# Patient Record
Sex: Female | Born: 1985
Health system: Southern US, Community
[De-identification: ages and names within clinical notes are randomized; demographics above are authoritative.]

## PROBLEM LIST (undated history)

## (undated) DIAGNOSIS — I10 Essential (primary) hypertension: Secondary | ICD-10-CM

---

## 2015-04-25 ENCOUNTER — Emergency Department (HOSPITAL_BASED_OUTPATIENT_CLINIC_OR_DEPARTMENT_OTHER)
Admission: EM | Admit: 2015-04-25 | Discharge: 2015-04-25 | Disposition: A | Discharge status: Home / Self Care | Payer: No Typology Code available for payment source | Attending: Emergency Medicine | Admitting: Emergency Medicine

## 2015-04-25 ENCOUNTER — Encounter (HOSPITAL_BASED_OUTPATIENT_CLINIC_OR_DEPARTMENT_OTHER): Payer: Self-pay | Admitting: *Deleted

## 2015-04-25 DIAGNOSIS — J029 Acute pharyngitis, unspecified: Secondary | ICD-10-CM | POA: Diagnosis present

## 2015-04-25 DIAGNOSIS — J020 Streptococcal pharyngitis: Secondary | ICD-10-CM | POA: Insufficient documentation

## 2015-04-25 LAB — RAPID STREP SCREEN (MED CTR MEBANE ONLY): Streptococcus, Group A Screen (Direct): POSITIVE — AB

## 2015-04-25 MED ORDER — SODIUM CHLORIDE 0.9 % IV BOLUS (SEPSIS)
1000.0000 mL | Freq: Once | INTRAVENOUS | Status: AC
  Administered 2015-04-25: 1000 mL via INTRAVENOUS

## 2015-04-25 MED ORDER — ACETAMINOPHEN-CODEINE 120-12 MG/5ML PO SOLN: 10.0000 mL | ORAL | Status: DC | PRN

## 2015-04-25 MED ORDER — IBUPROFEN 800 MG PO TABS
800.0000 mg | ORAL_TABLET | Freq: Once | ORAL | Status: AC
  Administered 2015-04-25: 800 mg via ORAL
  Filled 2015-04-25: qty 1

## 2015-04-25 MED ORDER — ACETAMINOPHEN 325 MG PO TABS
650.0000 mg | ORAL_TABLET | Freq: Once | ORAL | Status: AC
  Administered 2015-04-25: 650 mg via ORAL
  Filled 2015-04-25: qty 2

## 2015-04-25 MED ORDER — PENICILLIN G BENZATHINE 1200000 UNIT/2ML IM SUSP
1.2000 10*6.[IU] | Freq: Once | INTRAMUSCULAR | Status: AC
  Administered 2015-04-25: 1.2 10*6.[IU] via INTRAMUSCULAR
  Filled 2015-04-25: qty 2

## 2015-04-25 NOTE — ED Notes (Signed)
Pt c/o cough, congest, body aches onset last pm

## 2015-04-25 NOTE — ED Notes (Addendum)
Pt reports fever, body aches that started in the middle of the night.  Pt reports exposure to the flu. Pt non-compliant with BP meds.

## 2015-04-25 NOTE — ED Notes (Signed)
Pt wait time ends at 2000 for pencillin  IM injection pt instructed to leave if no problems by that time.

## 2015-04-25 NOTE — Discharge Instructions (Signed)
Return here as needed.  Increase your fluid intake. 

## 2015-04-25 NOTE — ED Provider Notes (Signed)
CSN: [id number]     Arrival date & time 04/25/15  1530 History   First MD Initiated Contact with Patient 04/25/15 1727     Chief Complaint  Patient presents with  . Influenza     (Consider location/radiation/quality/duration/timing/severity/associated sxs/prior Treatment) HPI Patient presents to the emergency department with sore throat, body aches and fevers that started last night.  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  The patient denies nausea, vomiting, weakness, dizziness, headache, blurred vision, back pain, neck pain, abdominal pain, incontinence, bloody stool, hematemesis, near syncope or syncope.  The patient states that her sore throat is her main symptom History reviewed. No pertinent past medical history. History reviewed. No pertinent past surgical history. History reviewed. No pertinent family history. Social History  Substance Use Topics  . Smoking status: Never Smoker   . Smokeless tobacco: None  . Alcohol Use: No   OB History    No data available     Review of Systems All other systems negative except as documented in the HPI. All pertinent positives and negatives as reviewed in the HPI.   Allergies  Review of patient's allergies indicates no known allergies.  Home Medications   Prior to Admission medications   Medication Sig Start Date End Date Taking? Authorizing Provider  acetaminophen-codeine 120-12 MG/5ML solution Take 10 mLs by mouth every 4 (four) hours as needed for moderate pain. 04/25/15   Paden Kuras, PA-C   BP 141/101 mmHg  Pulse 110  Temp(Src) 99.4 F (37.4 C) (Oral)  Resp 18  Ht 5\' 7"  (1.702 m)  Wt 89.359 kg  BMI 30.85 kg/m2  SpO2 99% Physical Exam  Constitutional: She is oriented to person, place, and time. She appears well-developed and well-nourished. No distress.  HENT:  Head: Normocephalic and  atraumatic.  Mouth/Throat: Uvula is midline. No trismus in the jaw. No uvula swelling. Posterior oropharyngeal edema and posterior oropharyngeal erythema present. No oropharyngeal exudate.  Eyes: Pupils are equal, round, and reactive to light.  Neck: Normal range of motion. Neck supple.  Cardiovascular: Normal rate, regular rhythm and normal heart sounds.  Exam reveals no gallop and no friction rub.   No murmur heard. Pulmonary/Chest: Effort normal and breath sounds normal. No respiratory distress. She has no wheezes.  Neurological: She is alert and oriented to person, place, and time. She exhibits normal muscle tone. Coordination normal.  Skin: Skin is warm and dry. No rash noted. No erythema.  Psychiatric: She has a normal mood and affect. Her behavior is normal.  Nursing note and vitals reviewed.   ED Course  Procedures (including critical care time) Labs Review Labs Reviewed  RAPID STREP SCREEN (NOT AT [facility]) - Abnormal; Notable for the following:    Streptococcus, Group A Screen (Direct) POSITIVE (*)    All other components within normal limits    Imaging Review No results found. I have personally reviewed and evaluated these images and lab results as part of my medical decision-making.  She will be treated for strep pharyngitis.  Told to return here as needed.  Patient agrees the plan and all questions were answered  Charlestine NightChristopher Soriya Worster, PA-C 04/26/15 [phone]  Rolan BuccoMelanie Belfi, MD 04/26/15 1747

## 2017-01-15 ENCOUNTER — Emergency Department (HOSPITAL_BASED_OUTPATIENT_CLINIC_OR_DEPARTMENT_OTHER)
Admission: EM | Admit: 2017-01-15 | Discharge: 2017-01-15 | Disposition: A | Discharge status: Home / Self Care | Payer: PRIVATE HEALTH INSURANCE | Attending: Emergency Medicine | Admitting: Emergency Medicine

## 2017-01-15 ENCOUNTER — Encounter (HOSPITAL_BASED_OUTPATIENT_CLINIC_OR_DEPARTMENT_OTHER): Payer: Self-pay | Admitting: Emergency Medicine

## 2017-01-15 ENCOUNTER — Other Ambulatory Visit: Payer: Self-pay

## 2017-01-15 DIAGNOSIS — H9202 Otalgia, left ear: Secondary | ICD-10-CM | POA: Diagnosis not present

## 2017-01-15 DIAGNOSIS — H9203 Otalgia, bilateral: Secondary | ICD-10-CM | POA: Diagnosis present

## 2017-01-15 DIAGNOSIS — H65192 Other acute nonsuppurative otitis media, left ear: Secondary | ICD-10-CM

## 2017-01-15 MED ORDER — FLUTICASONE PROPIONATE 50 MCG/ACT NA SUSP: 1.0000 | Freq: Every day | NASAL | 0 refills | Status: DC

## 2017-01-15 MED ORDER — CETIRIZINE HCL 10 MG PO TABS
10.0000 mg | ORAL_TABLET | Freq: Every day | ORAL | 0 refills | Status: DC
Start: 2017-01-15 — End: 2017-03-29

## 2017-01-15 MED ORDER — AMOXICILLIN-POT CLAVULANATE 875-125 MG PO TABS: 1.0000 | ORAL_TABLET | Freq: Two times a day (BID) | ORAL | 0 refills | Status: DC

## 2017-01-15 NOTE — ED Provider Notes (Signed)
[facility] EMERGENCY DEPARTMENT Provider Note   CSN: [id number] Arrival date & time: 01/15/17  1910     History   Chief Complaint Chief Complaint  Patient presents with  . Otalgia    HPI Sophia Carpenter is a 31 y.o. female who presents with bilateral ear pressure. No significant PMH. She states that she has had symptoms for about 2 weeks. She has had ear fullness, decreased hearing, popping in the ear. She denies pain but states it's "just a lot of pressure". The R ear is worse than the L. She has occasional nasal congestion. No fever, chills, URI symptoms, SOB, coughing. She has been taking Vick's cough/cold medicine with no relief.   HPI  History reviewed. No pertinent past medical history.  There are no active problems to display for this patient.   History reviewed. No pertinent surgical history.  OB History    No data available       Home Medications    Prior to Admission medications   Medication Sig Start Date End Date Taking? Authorizing Provider  acetaminophen-codeine 120-12 MG/5ML solution Take 10 mLs by mouth every 4 (four) hours as needed for moderate pain. 04/25/15   Charlestine NightLawyer, Christopher, PA-C    Family History History reviewed. No pertinent family history.  Social History Social History   Tobacco Use  . Smoking status: Never Smoker  . Smokeless tobacco: Never Used  Substance Use Topics  . Alcohol use: No  . Drug use: No     Allergies   Patient has no known allergies.   Review of Systems Review of Systems  Constitutional: Negative for chills and fever.  HENT: Positive for congestion and ear pain (pressure). Negative for ear discharge, rhinorrhea and sore throat.   Respiratory: Negative for cough and shortness of breath.   Cardiovascular: Negative for chest pain.     Physical Exam Updated Vital Signs BP (!) 176/115 (BP Location: Left Arm)   Pulse 93   Temp 98.5 F (36.9 C) (Oral)   Resp 18   Ht 5\' 7"  (1.702 m)   Wt 113.4  kg (250 lb)   LMP 12/16/2016   SpO2 100%   BMI 39.16 kg/m   Physical Exam  Constitutional: She is oriented to person, place, and time. She appears well-developed and well-nourished. No distress.  HENT:  Head: Normocephalic and atraumatic.  Right Ear: Hearing, tympanic membrane, external ear and ear canal normal.  Left Ear: Hearing, external ear and ear canal normal. Tympanic membrane is not injected and not erythematous. A middle ear effusion is present.  Nose: Nasal deformity present.  Mouth/Throat: Uvula is midline, oropharynx is clear and moist and mucous membranes are normal.  Eyes: Conjunctivae are normal. Pupils are equal, round, and reactive to light. Right eye exhibits no discharge. Left eye exhibits no discharge. No scleral icterus.  Neck: Normal range of motion.  Cardiovascular: Normal rate.  Pulmonary/Chest: Effort normal. No respiratory distress.  Abdominal: She exhibits no distension.  Neurological: She is alert and oriented to person, place, and time.  Skin: Skin is warm and dry.  Psychiatric: She has a normal mood and affect. Her behavior is normal.  Nursing note and vitals reviewed.    ED Treatments / Results  Labs (all labs ordered are listed, but only abnormal results are displayed) Labs Reviewed - No data to display  EKG  EKG Interpretation None       Radiology No results found.  Procedures Procedures (including critical care time)  Medications Ordered  in ED Medications - No data to display   Initial Impression / Assessment and Plan / ED Course  I have reviewed the triage vital signs and the nursing notes.  Pertinent labs & imaging results that were available during my care of the patient were reviewed by me and considered in my medical decision making (see chart for details).  31 year old with bilateral ear pressure and effusion on exam. She is hypertensive but otherwise vitals are normal. Will treat with Augmentin for 5 days as well as Flonase  and Zyrtec. Return precautions given.  Final Clinical Impressions(s) / ED Diagnoses   Final diagnoses:  Acute effusion of left ear    ED Discharge Orders    None       Bethel BornGekas, Karrine Kluttz Marie, PA-C 01/16/17 1702    Arby BarrettePfeiffer, Marcy, MD 01/17/17 [phone]

## 2017-01-15 NOTE — Discharge Instructions (Signed)
Take Augmentin twice a day for 5 days Use Flonase several times a day Take Zyrtec once daily

## 2017-01-15 NOTE — ED Triage Notes (Signed)
Patient states that she is having bilateral ear pain and fullness. Patient reports that it became worse today

## 2017-03-29 ENCOUNTER — Emergency Department (HOSPITAL_BASED_OUTPATIENT_CLINIC_OR_DEPARTMENT_OTHER)
Admission: EM | Admit: 2017-03-29 | Discharge: 2017-03-29 | Disposition: A | Discharge status: Home / Self Care | Payer: PRIVATE HEALTH INSURANCE | Attending: Emergency Medicine | Admitting: Emergency Medicine

## 2017-03-29 ENCOUNTER — Encounter (HOSPITAL_BASED_OUTPATIENT_CLINIC_OR_DEPARTMENT_OTHER): Payer: Self-pay

## 2017-03-29 ENCOUNTER — Other Ambulatory Visit: Payer: Self-pay

## 2017-03-29 ENCOUNTER — Emergency Department (HOSPITAL_BASED_OUTPATIENT_CLINIC_OR_DEPARTMENT_OTHER): Payer: PRIVATE HEALTH INSURANCE

## 2017-03-29 DIAGNOSIS — J111 Influenza due to unidentified influenza virus with other respiratory manifestations: Secondary | ICD-10-CM | POA: Insufficient documentation

## 2017-03-29 DIAGNOSIS — R69 Illness, unspecified: Secondary | ICD-10-CM

## 2017-03-29 DIAGNOSIS — I10 Essential (primary) hypertension: Secondary | ICD-10-CM | POA: Insufficient documentation

## 2017-03-29 HISTORY — DX: Essential (primary) hypertension: I10

## 2017-03-29 LAB — PREGNANCY, URINE: Preg Test, Ur: NEGATIVE

## 2017-03-29 MED ORDER — BENZONATATE 100 MG PO CAPS: 100.0000 mg | ORAL_CAPSULE | Freq: Three times a day (TID) | ORAL | 0 refills | Status: DC | PRN

## 2017-03-29 MED FILL — BENZONATATE 100 MG CAPSULE: 100 | 7 days supply | Qty: 21 | Fill #0

## 2017-03-29 NOTE — ED Triage Notes (Signed)
C/o flu like sx day 5-NAD-steady gait 

## 2017-03-29 NOTE — ED Provider Notes (Signed)
[facility] EMERGENCY DEPARTMENT Provider Note   CSN: [id number] Arrival date & time: 03/29/17  1102     History   Chief Complaint Chief Complaint  Patient presents with  . Cough    HPI Sophia Carpenter is a 32 y.o. female.  The history is provided by the patient. No language interpreter was used.  Cough    Sophia Carpenter is a 32 y.o. female who presents to the Emergency Department complaining of flu like sxs.  She began feeling sick three days ago with generalized weakness with associated chills.  Two days ago she developed cough that is rarely productive.  Denies fevers, N/V/D, leg swelling/pain, dysuria.  She has chest soreness associated with coughing.  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  No medical problems, takes no medications.  Used to take BP meds but is no longer on them.   Past Medical History:  Diagnosis Date  . Hypertension     There are no active problems to display for this patient.   History reviewed. No pertinent surgical history.  OB History    No data available       Home Medications    Prior to Admission medications   Medication Sig Start Date End Date Taking? Authorizing Provider  benzonatate (TESSALON) 100 MG capsule Take 1 capsule (100 mg total) by mouth 3 (three) times daily as needed for cough. 03/29/17   Tilden Fossaees, Alberta Cairns, MD    Family History No family history on file.  Social History Social History   Tobacco Use  . Smoking status: Never Smoker  . Smokeless tobacco: Never Used  Substance Use Topics  . Alcohol use: Yes    Comment: occ  . Drug use: No     Allergies   Patient has no known allergies.   Review of Systems Review of Systems  Respiratory: Positive for cough.   All other systems reviewed and are negative.    Physical Exam Updated Vital Signs BP (!) 158/100 (BP Location: Right Arm)   Pulse (!) 112   Temp 99.4 F (37.4 C)  (Oral)   Resp 18   Ht 5\' 7"  (1.702 m)   Wt 112.4 kg (247 lb 12.8 oz)   LMP 02/26/2017   SpO2 97%   BMI 38.81 kg/m   Physical Exam  Constitutional: She is oriented to person, place, and time. She appears well-developed and well-nourished.  HENT:  Head: Normocephalic and atraumatic.  Mouth/Throat: Oropharynx is clear and moist.  Cardiovascular: Regular rhythm.  No murmur heard. tachycardic  Pulmonary/Chest: Effort normal and breath sounds normal. No respiratory distress.  Abdominal: Soft. There is no tenderness. There is no rebound and no guarding.  Musculoskeletal: She exhibits no edema or tenderness.  Neurological: She is alert and oriented to person, place, and time.  Skin: Skin is warm and dry.  Psychiatric: She has a normal mood and affect. Her behavior is normal.  Nursing note and vitals reviewed.    ED Treatments / Results  Labs (all labs ordered are listed, but only abnormal results are displayed) Labs Reviewed  PREGNANCY, URINE    EKG  EKG Interpretation None       Radiology Dg Chest 2 View  Result Date: 03/29/2017 CLINICAL DATA:  Cough and congestion for the past week. EXAM: CHEST  2 VIEW COMPARISON:  None. FINDINGS: The cardiac silhouette is at the upper limits of normal in size. Normal mediastinal contours. Normal pulmonary  vascularity. No focal consolidation, pleural effusion, or pneumothorax. No acute osseous abnormality. IMPRESSION: No active cardiopulmonary disease. Electronically Signed   By: Obie Dredge M.D.   On: 03/29/2017 12:40    Procedures Procedures (including critical care time)  Medications Ordered in ED Medications - No data to display   Initial Impression / Assessment and Plan / ED Course  I have reviewed the triage vital signs and the nursing notes.  Pertinent labs & imaging results that were available during my care of the patient were reviewed by me and considered in my medical decision making (see chart for details).      Pt with hx/o HTN here for evaluation cough, sore throat, weakness.  She is nontoxic appearing on exam with no respiratory distress, multiple sick contacts.  Presentation is not c/w serious bacterial infection, pna, CHF, sepsis.  Discussed with patient home care for viral URI, possible flu.  Discussed outpatient follow up and return precautions.    Final Clinical Impressions(s) / ED Diagnoses   Final diagnoses:  Influenza-like illness    ED Discharge Orders        Ordered    benzonatate (TESSALON) 100 MG capsule  3 times daily PRN     03/29/17 1355       Tilden Fossa, MD 03/29/17 1654

## 2018-01-20 ENCOUNTER — Other Ambulatory Visit: Payer: Self-pay

## 2018-01-20 ENCOUNTER — Emergency Department (HOSPITAL_BASED_OUTPATIENT_CLINIC_OR_DEPARTMENT_OTHER)
Admission: EM | Admit: 2018-01-20 | Discharge: 2018-01-20 | Disposition: A | Discharge status: Home / Self Care | Payer: PRIVATE HEALTH INSURANCE | Attending: Emergency Medicine | Admitting: Emergency Medicine

## 2018-01-20 ENCOUNTER — Encounter (HOSPITAL_BASED_OUTPATIENT_CLINIC_OR_DEPARTMENT_OTHER): Payer: Self-pay | Admitting: *Deleted

## 2018-01-20 DIAGNOSIS — I10 Essential (primary) hypertension: Secondary | ICD-10-CM | POA: Diagnosis not present

## 2018-01-20 DIAGNOSIS — J020 Streptococcal pharyngitis: Secondary | ICD-10-CM | POA: Insufficient documentation

## 2018-01-20 DIAGNOSIS — J029 Acute pharyngitis, unspecified: Secondary | ICD-10-CM | POA: Diagnosis present

## 2018-01-20 LAB — GROUP A STREP BY PCR: GROUP A STREP BY PCR: DETECTED — AB

## 2018-01-20 MED ORDER — DEXAMETHASONE 6 MG PO TABS
10.0000 mg | ORAL_TABLET | Freq: Once | ORAL | Status: AC
  Administered 2018-01-20: 10 mg via ORAL
  Filled 2018-01-20: qty 1

## 2018-01-20 MED ORDER — PENICILLIN G BENZATHINE 1200000 UNIT/2ML IM SUSP
1.2000 10*6.[IU] | Freq: Once | INTRAMUSCULAR | Status: AC
  Administered 2018-01-20: 1.2 10*6.[IU] via INTRAMUSCULAR
  Filled 2018-01-20: qty 2

## 2018-01-20 NOTE — ED Triage Notes (Signed)
Pt c/o sore throat x 3 days

## 2018-01-20 NOTE — Discharge Instructions (Signed)
Sure you change her toothbrush in 2 to 3 days.  Use Chloraseptic spray, Tylenol or ibuprofen for the pain.

## 2018-01-20 NOTE — ED Provider Notes (Signed)
[facility] EMERGENCY DEPARTMENT Provider Note   CSN: [id number] Arrival date & time: 01/20/18  [id number]     History   Chief Complaint Chief Complaint  Patient presents with  . Sore Throat    HPI Kari Baarsshley Garofano is a 32 y.o. female.  The history is provided by the patient.  Sore Throat  This is a new problem. The current episode started yesterday. The problem occurs constantly. The problem has been gradually worsening. Associated symptoms comments: Fever, sore throat, chills.  No cough or congestion. The symptoms are aggravated by swallowing. Nothing relieves the symptoms. Treatments tried: chloroseptic spray and motrin. The treatment provided mild relief.    Past Medical History:  Diagnosis Date  . Hypertension     There are no active problems to display for this patient.   History reviewed. No pertinent surgical history.   OB History   None      Home Medications    Prior to Admission medications   Not on File    Family History History reviewed. No pertinent family history.  Social History Social History   Tobacco Use  . Smoking status: Never Smoker  . Smokeless tobacco: Never Used  Substance Use Topics  . Alcohol use: Yes    Comment: occ  . Drug use: No     Allergies   Patient has no known allergies.   Review of Systems Review of Systems  All other systems reviewed and are negative.    Physical Exam Updated Vital Signs BP (!) 165/137 (BP Location: Right Arm)   Pulse (!) 110   Temp 99.2 F (37.3 C) (Oral)   Resp 17   Ht 5\' 7"  (1.702 m)   Wt 115.7 kg   LMP 12/25/2017   SpO2 99%   BMI 39.94 kg/m   Physical Exam  Constitutional: She is oriented to person, place, and time. She appears well-developed and well-nourished. No distress.  HENT:  Head: Normocephalic and atraumatic.  Right Ear: Tympanic membrane normal.  Left Ear: Tympanic membrane normal.  Mouth/Throat: Mucous membranes are dry. Posterior oropharyngeal edema and  posterior oropharyngeal erythema present. Tonsillar exudate.  Eyes: Pupils are equal, round, and reactive to light. Conjunctivae and EOM are normal.  Neck: Normal range of motion. Neck supple.  Cardiovascular: Regular rhythm and intact distal pulses. Tachycardia present.  No murmur heard. Pulmonary/Chest: Effort normal and breath sounds normal. No respiratory distress. She has no wheezes. She has no rales.  Musculoskeletal: Normal range of motion. She exhibits no edema or tenderness.  Lymphadenopathy:    She has cervical adenopathy.  Neurological: She is alert and oriented to person, place, and time.  Skin: Skin is warm and dry. No rash noted. No erythema.  Psychiatric: She has a normal mood and affect. Her behavior is normal.  Nursing note and vitals reviewed.    ED Treatments / Results  Labs (all labs ordered are listed, but only abnormal results are displayed) Labs Reviewed  GROUP A STREP BY PCR - Abnormal; Notable for the following components:      Result Value   Group A Strep by PCR DETECTED (*)    All other components within normal limits    EKG None  Radiology No results found.  Procedures Procedures (including critical care time)  Medications Ordered in ED Medications  penicillin g benzathine (BICILLIN LA) 1200000 UNIT/2ML injection 1.2 Million Units (1.2 Million Units Intramuscular Given 01/20/18 2059)  dexamethasone (DECADRON) tablet 10 mg (10 mg Oral Given 01/20/18 2058)  Initial Impression / Assessment and Plan / ED Course  I have reviewed the triage vital signs and the nursing notes.  Pertinent labs & imaging results that were available during my care of the patient were reviewed by me and considered in my medical decision making (see chart for details).     Patient is a 32 year old otherwise healthy female presenting today with symptoms consistent with strep throat.  She has tonsillar exudates bilaterally but is tolerating her secretions and has no  airway compromise.  Low suspicion for RPA, PTA or epiglottitis.  Rapid strep was positive.  Patient was treated with Decadron and penicillin IM.  Final Clinical Impressions(s) / ED Diagnoses   Final diagnoses:  Strep pharyngitis    ED Discharge Orders    None       Gwyneth Sprout, MD 01/20/18 2358

## 2019-04-10 IMAGING — CR DG CHEST 2V
2 series · 2 of 2 positions shown · non-contrast
Comparison: None.

CLINICAL DATA: Cough and congestion for the past week.

EXAM:
CHEST  2 VIEW

[w chest pa]
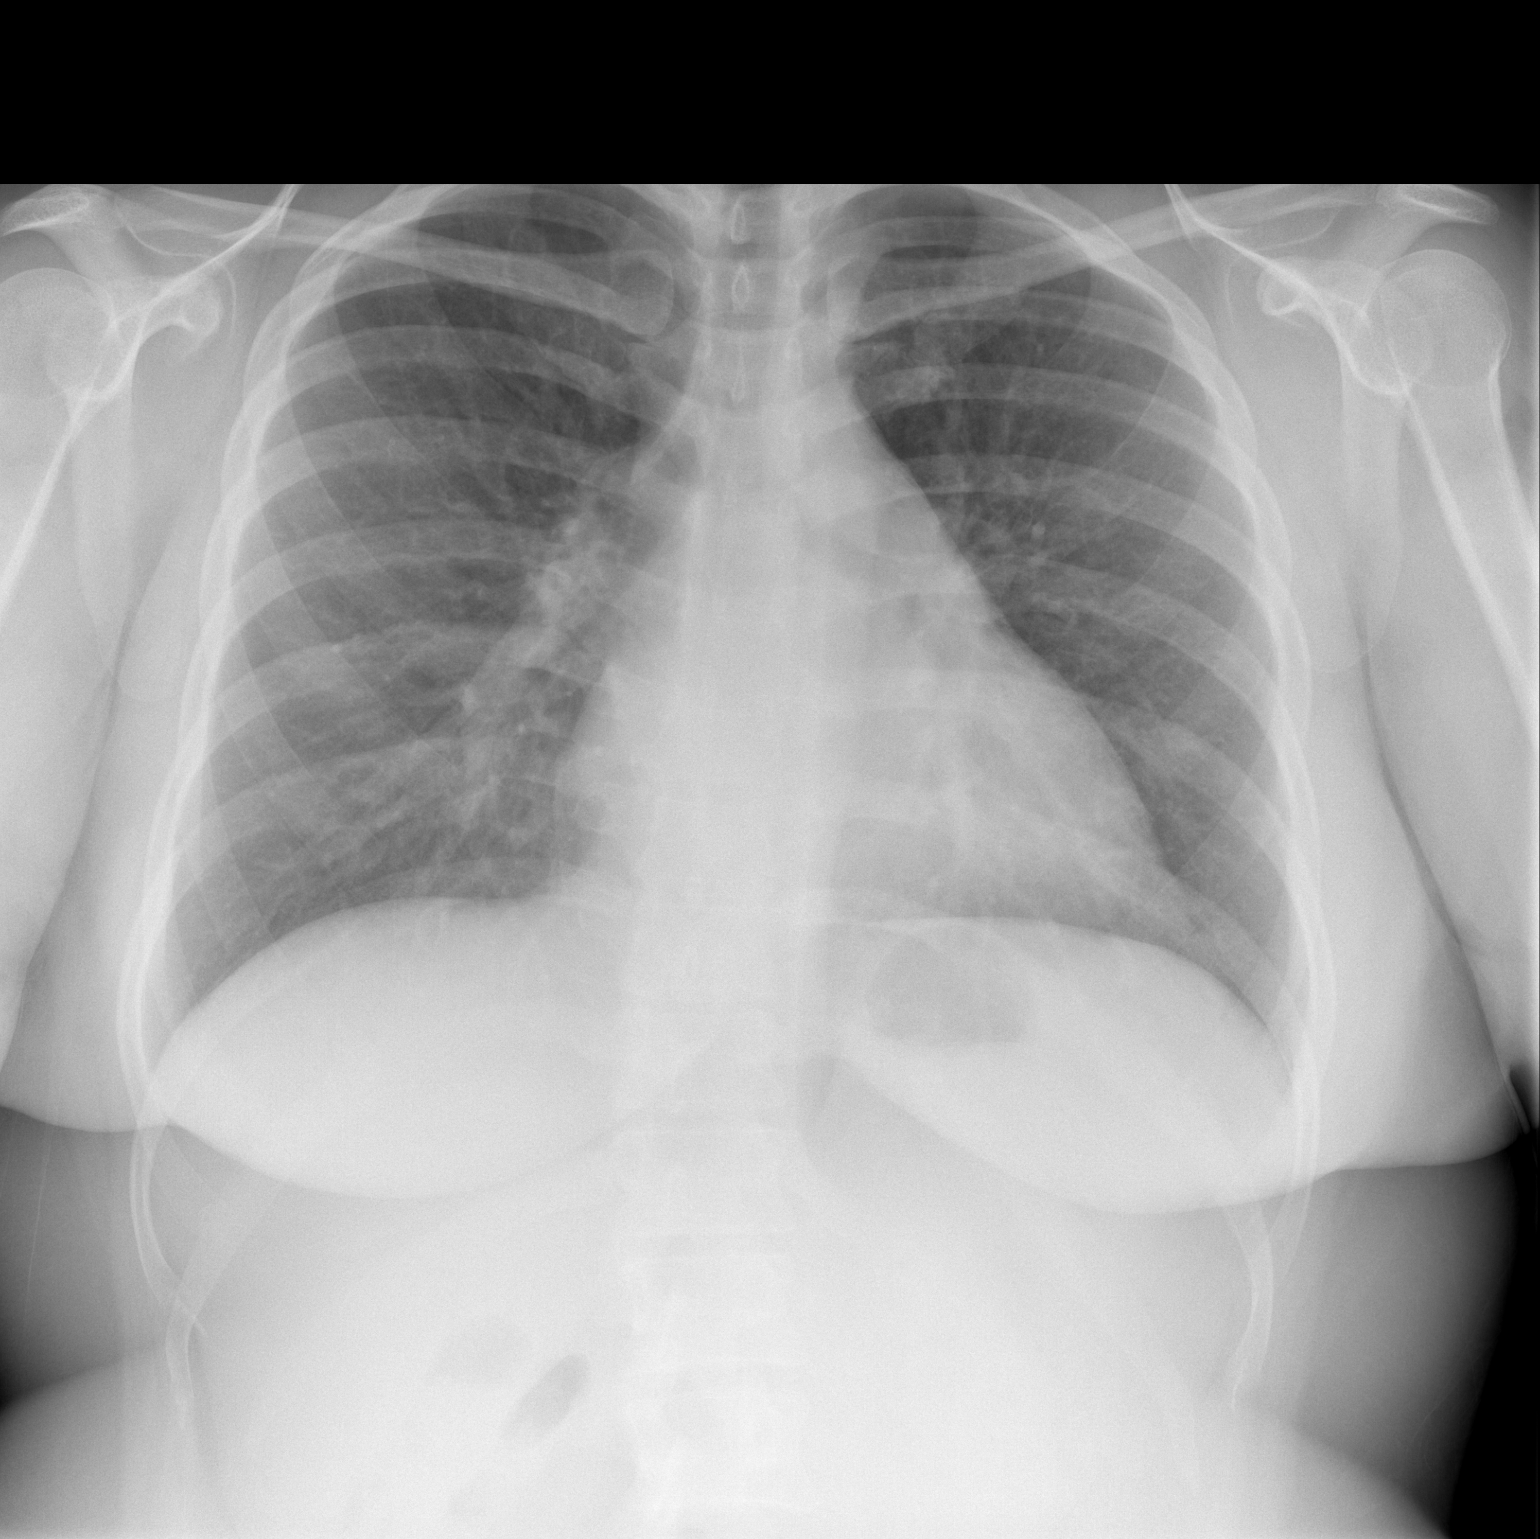

[w chest lat]
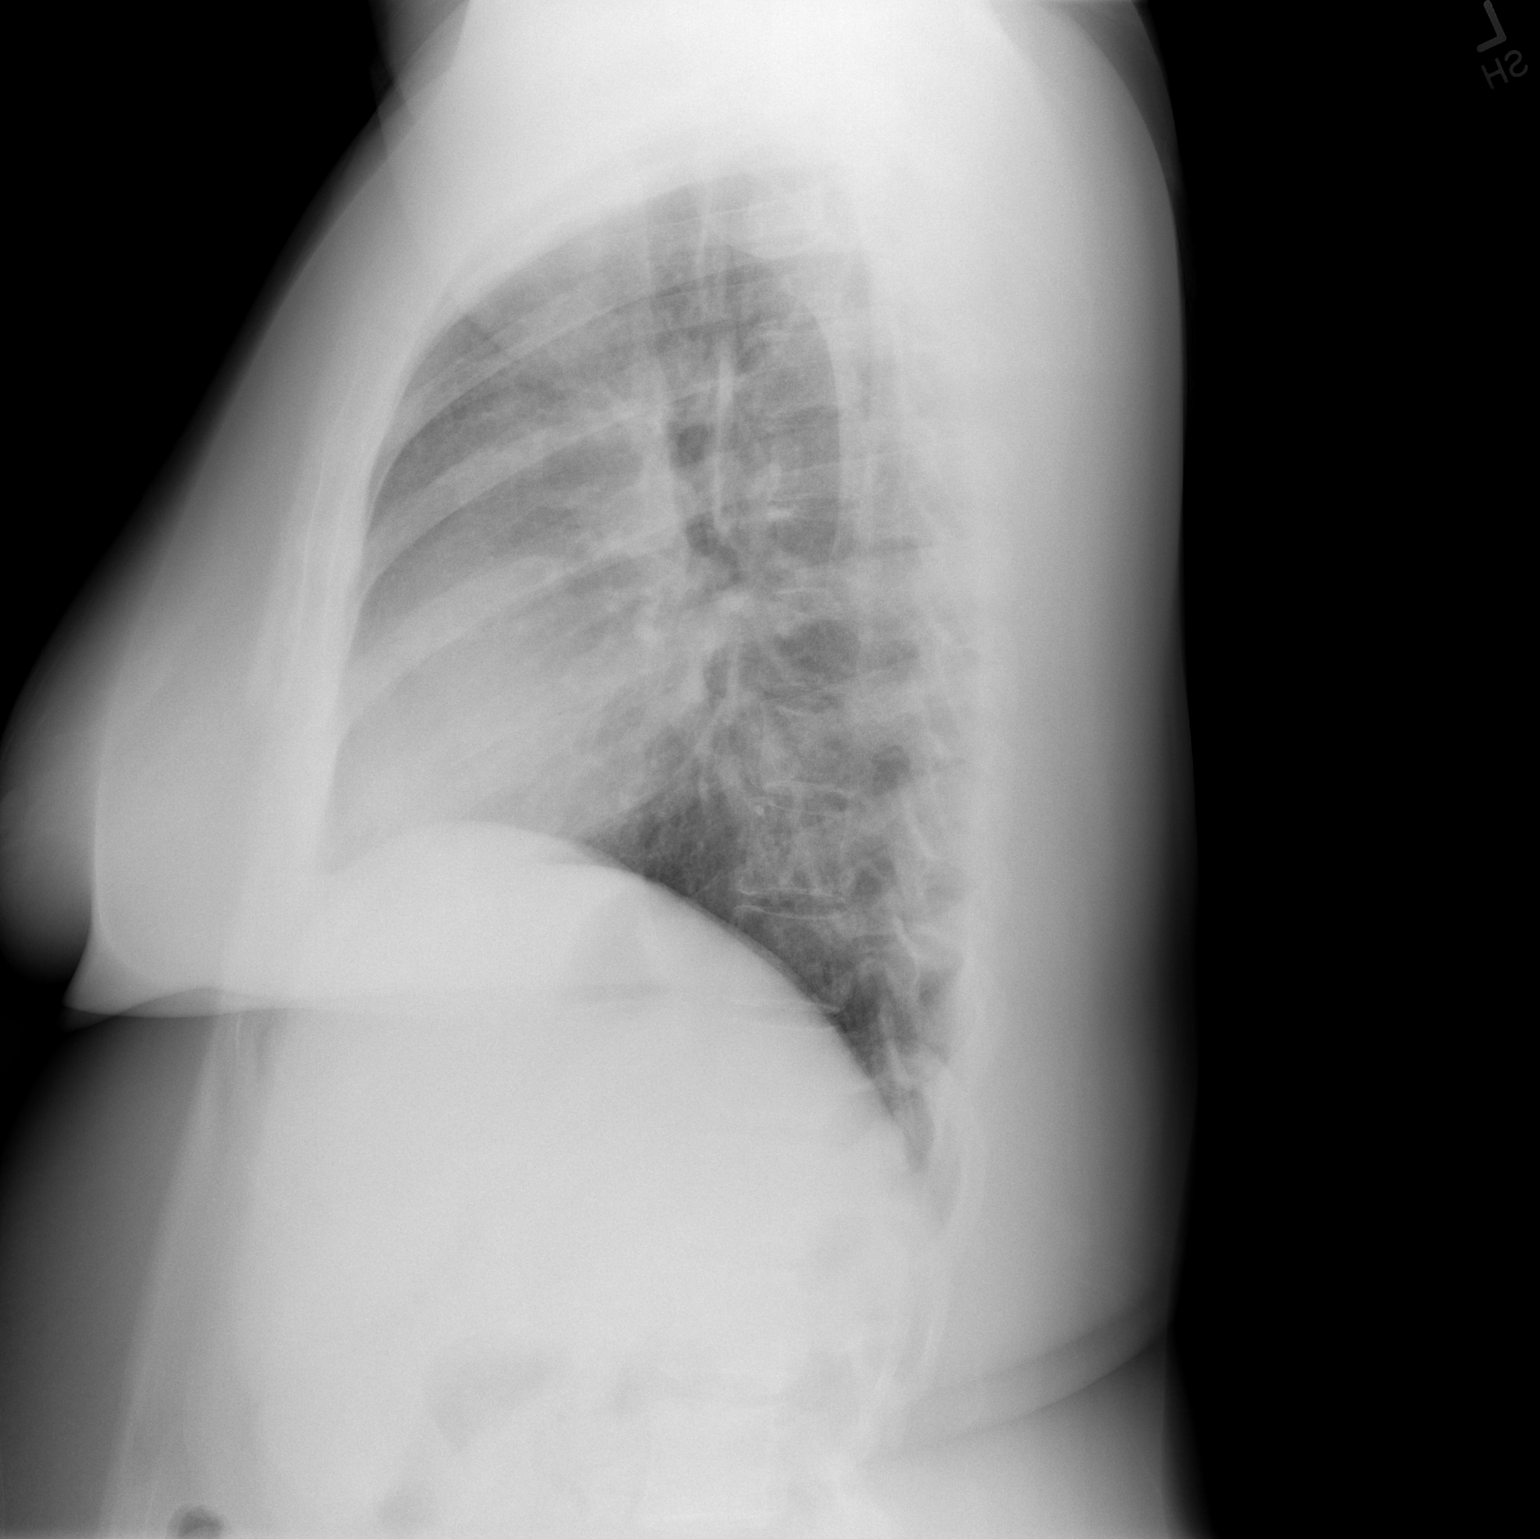

[2 of 2 positions shown; findings below may reference images not displayed]

FINDINGS: The cardiac silhouette is at the upper limits of normal in size.
Normal mediastinal contours. Normal pulmonary vascularity. No focal
consolidation, pleural effusion, or pneumothorax. No acute osseous
abnormality.
IMPRESSION: No active cardiopulmonary disease.
